# Patient Record
Sex: Female | Born: 1935 | Race: White | Hispanic: No | Smoking: Never smoker
Health system: Southern US, Community
[De-identification: ages and names within clinical notes are randomized; demographics above are authoritative.]

## PROBLEM LIST (undated history)

## (undated) DIAGNOSIS — F32A Depression, unspecified: Secondary | ICD-10-CM

## (undated) DIAGNOSIS — F329 Major depressive disorder, single episode, unspecified: Secondary | ICD-10-CM

## (undated) DIAGNOSIS — F419 Anxiety disorder, unspecified: Secondary | ICD-10-CM

## (undated) DIAGNOSIS — I10 Essential (primary) hypertension: Secondary | ICD-10-CM

## (undated) HISTORY — DX: Major depressive disorder, single episode, unspecified: F32.9

## (undated) HISTORY — DX: Essential (primary) hypertension: I10

## (undated) HISTORY — DX: Depression, unspecified: F32.A

## (undated) HISTORY — DX: Anxiety disorder, unspecified: F41.9

---

## 1971-12-24 HISTORY — PX: VEIN SURGERY: SHX48

## 1981-12-23 HISTORY — PX: HERNIA REPAIR: SHX51

## 2000-10-02 ENCOUNTER — Encounter: Payer: Self-pay | Admitting: *Deleted

## 2000-10-02 ENCOUNTER — Encounter: Admission: RE | Admit: 2000-10-02 | Discharge: 2000-10-02 | Payer: Self-pay | Admitting: *Deleted

## 2001-09-28 ENCOUNTER — Encounter: Admission: RE | Admit: 2001-09-28 | Discharge: 2001-09-28 | Payer: Self-pay | Admitting: Family Medicine

## 2001-09-28 ENCOUNTER — Encounter: Payer: Self-pay | Admitting: Family Medicine

## 2002-08-31 ENCOUNTER — Encounter: Payer: Self-pay | Admitting: Family Medicine

## 2002-08-31 ENCOUNTER — Ambulatory Visit (HOSPITAL_COMMUNITY): Admission: RE | Admit: 2002-08-31 | Discharge: 2002-08-31 | Payer: Self-pay | Admitting: Family Medicine

## 2002-10-01 ENCOUNTER — Encounter: Payer: Self-pay | Admitting: *Deleted

## 2002-10-01 ENCOUNTER — Encounter: Admission: RE | Admit: 2002-10-01 | Discharge: 2002-10-01 | Payer: Self-pay | Admitting: *Deleted

## 2002-12-29 ENCOUNTER — Encounter: Payer: Self-pay | Admitting: Neurosurgery

## 2002-12-29 ENCOUNTER — Encounter: Admission: RE | Admit: 2002-12-29 | Discharge: 2002-12-29 | Payer: Self-pay | Admitting: Neurosurgery

## 2003-01-12 ENCOUNTER — Encounter: Payer: Self-pay | Admitting: Neurosurgery

## 2003-01-12 ENCOUNTER — Encounter: Admission: RE | Admit: 2003-01-12 | Discharge: 2003-01-12 | Payer: Self-pay | Admitting: Neurosurgery

## 2003-01-28 ENCOUNTER — Encounter: Admission: RE | Admit: 2003-01-28 | Discharge: 2003-01-28 | Payer: Self-pay | Admitting: Neurosurgery

## 2003-01-28 ENCOUNTER — Encounter: Payer: Self-pay | Admitting: Neurosurgery

## 2003-10-25 ENCOUNTER — Encounter: Admission: RE | Admit: 2003-10-25 | Discharge: 2003-10-25 | Payer: Self-pay | Admitting: *Deleted

## 2004-11-16 ENCOUNTER — Encounter: Admission: RE | Admit: 2004-11-16 | Discharge: 2004-11-16 | Payer: Self-pay | Admitting: *Deleted

## 2004-12-04 ENCOUNTER — Encounter: Admission: RE | Admit: 2004-12-04 | Discharge: 2004-12-04 | Payer: Self-pay | Admitting: *Deleted

## 2005-01-01 ENCOUNTER — Encounter (INDEPENDENT_AMBULATORY_CARE_PROVIDER_SITE_OTHER): Payer: Self-pay | Admitting: *Deleted

## 2005-01-01 ENCOUNTER — Ambulatory Visit (HOSPITAL_BASED_OUTPATIENT_CLINIC_OR_DEPARTMENT_OTHER): Admission: RE | Admit: 2005-01-01 | Discharge: 2005-01-01 | Payer: Self-pay | Admitting: General Surgery

## 2005-01-01 ENCOUNTER — Ambulatory Visit (HOSPITAL_COMMUNITY): Admission: RE | Admit: 2005-01-01 | Discharge: 2005-01-01 | Payer: Self-pay | Admitting: General Surgery

## 2005-01-01 ENCOUNTER — Encounter: Admission: RE | Admit: 2005-01-01 | Discharge: 2005-01-01 | Payer: Self-pay | Admitting: General Surgery

## 2005-12-23 HISTORY — PX: SHOULDER SURGERY: SHX246

## 2006-01-01 ENCOUNTER — Encounter: Admission: RE | Admit: 2006-01-01 | Discharge: 2006-01-01 | Payer: Self-pay | Admitting: Family Medicine

## 2006-01-10 ENCOUNTER — Ambulatory Visit (HOSPITAL_COMMUNITY): Admission: RE | Admit: 2006-01-10 | Discharge: 2006-01-10 | Payer: Self-pay | Admitting: Family Medicine

## 2006-03-25 ENCOUNTER — Ambulatory Visit (HOSPITAL_COMMUNITY): Admission: RE | Admit: 2006-03-25 | Discharge: 2006-03-25 | Payer: Self-pay | Admitting: Orthopaedic Surgery

## 2006-04-01 ENCOUNTER — Encounter (HOSPITAL_COMMUNITY): Admission: RE | Admit: 2006-04-01 | Discharge: 2006-05-01 | Payer: Self-pay | Admitting: Orthopaedic Surgery

## 2006-05-13 ENCOUNTER — Encounter (HOSPITAL_COMMUNITY): Admission: RE | Admit: 2006-05-13 | Discharge: 2006-06-12 | Payer: Self-pay | Admitting: Orthopaedic Surgery

## 2006-05-27 ENCOUNTER — Observation Stay (HOSPITAL_COMMUNITY): Admission: RE | Admit: 2006-05-27 | Discharge: 2006-05-28 | Payer: Self-pay | Admitting: Orthopaedic Surgery

## 2006-05-27 ENCOUNTER — Encounter (INDEPENDENT_AMBULATORY_CARE_PROVIDER_SITE_OTHER): Payer: Self-pay | Admitting: Specialist

## 2006-07-02 ENCOUNTER — Encounter (HOSPITAL_COMMUNITY): Admission: RE | Admit: 2006-07-02 | Discharge: 2006-08-01 | Payer: Self-pay | Admitting: Orthopaedic Surgery

## 2006-07-09 ENCOUNTER — Inpatient Hospital Stay (HOSPITAL_COMMUNITY): Admission: EM | Admit: 2006-07-09 | Discharge: 2006-07-15 | Payer: Self-pay | Admitting: Emergency Medicine

## 2006-07-10 ENCOUNTER — Ambulatory Visit: Payer: Self-pay | Admitting: *Deleted

## 2006-08-04 ENCOUNTER — Emergency Department (HOSPITAL_COMMUNITY): Admission: EM | Admit: 2006-08-04 | Discharge: 2006-08-05 | Payer: Self-pay | Admitting: Emergency Medicine

## 2006-08-09 ENCOUNTER — Inpatient Hospital Stay (HOSPITAL_COMMUNITY): Admission: EM | Admit: 2006-08-09 | Discharge: 2006-08-13 | Payer: Self-pay | Admitting: Emergency Medicine

## 2006-09-30 ENCOUNTER — Encounter (HOSPITAL_COMMUNITY): Admission: RE | Admit: 2006-09-30 | Discharge: 2006-11-04 | Payer: Self-pay | Admitting: Orthopaedic Surgery

## 2006-10-07 ENCOUNTER — Encounter (HOSPITAL_COMMUNITY): Admission: RE | Admit: 2006-10-07 | Discharge: 2006-11-04 | Payer: Self-pay | Admitting: Neurosurgery

## 2007-01-09 ENCOUNTER — Encounter: Admission: RE | Admit: 2007-01-09 | Discharge: 2007-01-09 | Payer: Self-pay | Admitting: Internal Medicine

## 2007-09-29 ENCOUNTER — Encounter: Admission: RE | Admit: 2007-09-29 | Discharge: 2007-09-29 | Payer: Self-pay | Admitting: Neurosurgery

## 2007-12-29 ENCOUNTER — Encounter (HOSPITAL_COMMUNITY): Admission: RE | Admit: 2007-12-29 | Discharge: 2008-01-28 | Payer: Self-pay | Admitting: Neurosurgery

## 2008-02-05 ENCOUNTER — Encounter: Admission: RE | Admit: 2008-02-05 | Discharge: 2008-02-05 | Payer: Self-pay | Admitting: Internal Medicine

## 2008-02-06 ENCOUNTER — Encounter: Admission: RE | Admit: 2008-02-06 | Discharge: 2008-02-06 | Payer: Self-pay | Admitting: Neurosurgery

## 2008-10-25 ENCOUNTER — Emergency Department (HOSPITAL_COMMUNITY): Admission: EM | Admit: 2008-10-25 | Discharge: 2008-10-26 | Payer: Self-pay | Admitting: Emergency Medicine

## 2008-12-13 ENCOUNTER — Emergency Department (HOSPITAL_COMMUNITY): Admission: EM | Admit: 2008-12-13 | Discharge: 2008-12-13 | Payer: Self-pay | Admitting: Emergency Medicine

## 2008-12-19 ENCOUNTER — Encounter (HOSPITAL_COMMUNITY): Admission: RE | Admit: 2008-12-19 | Discharge: 2009-01-18 | Payer: Self-pay | Admitting: Oncology

## 2008-12-19 ENCOUNTER — Ambulatory Visit (HOSPITAL_COMMUNITY): Payer: Self-pay | Admitting: Oncology

## 2009-02-07 ENCOUNTER — Encounter: Admission: RE | Admit: 2009-02-07 | Discharge: 2009-02-07 | Payer: Self-pay | Admitting: Internal Medicine

## 2009-03-21 IMAGING — CT CT ABDOMEN W/ CM
1 of 2 series · 14 of 32 positions shown, 19 images · IV contrast (Omnipaque 300)
Comparison: 01/10/2006

CT ABDOMEN

CLINICAL DATA: Unexplained hypercoagulability. Evaluate for occult
malignancy.

CT ABDOMEN AND PELVIS WITH CONTRAST
TECHNIQUE: Multidetector CT imaging of the abdomen and pelvis was
performed using the standard protocol following bolus
administration of intravenous contrast.
Contrast: 100 ml intravenous Zmnipaque-H33.

[Series 3: abd_pel 5.0 b40f · axial · 0.84mm/px · z∈[-566,-131]mm · 14 of 97 slices shown, 19 images]
[im 5/97  soft-tissue]
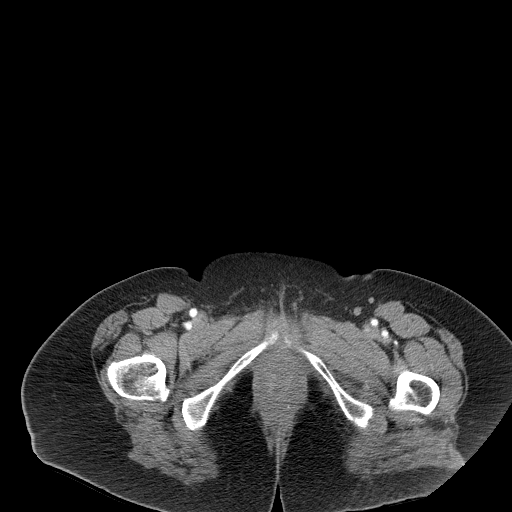
[im 5/97  bone]
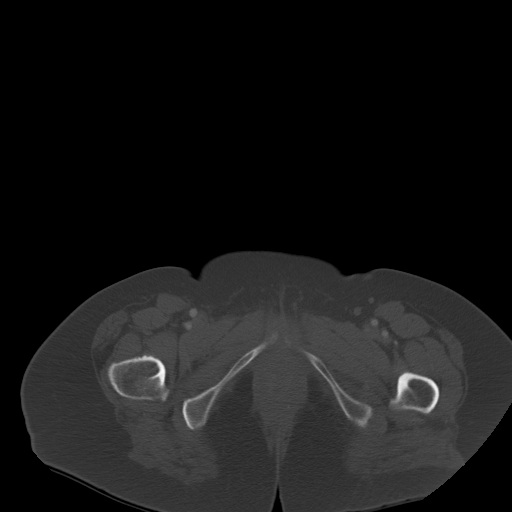
[im 15/97  soft-tissue]
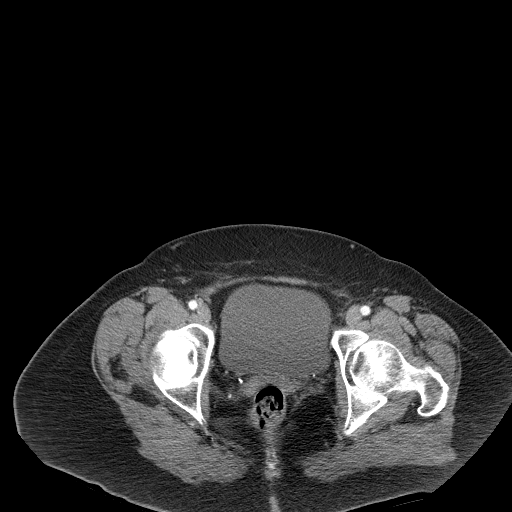
[im 20/97  soft-tissue]
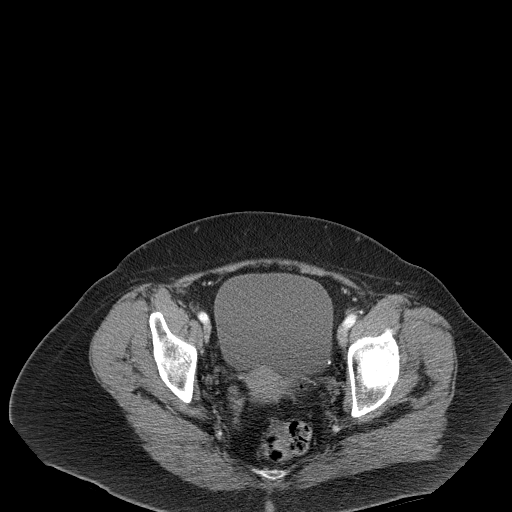
[im 29/97  soft-tissue]
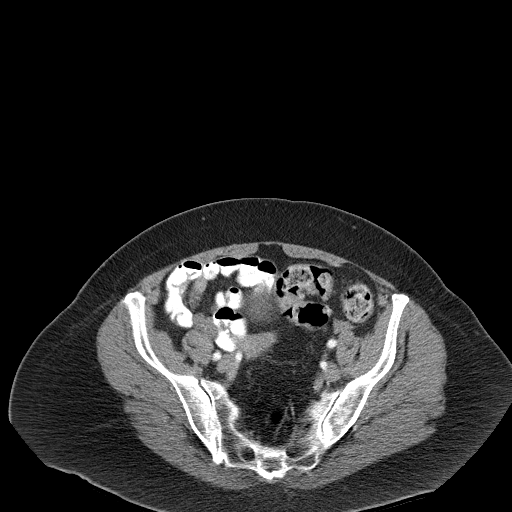
[im 34/97  soft-tissue]
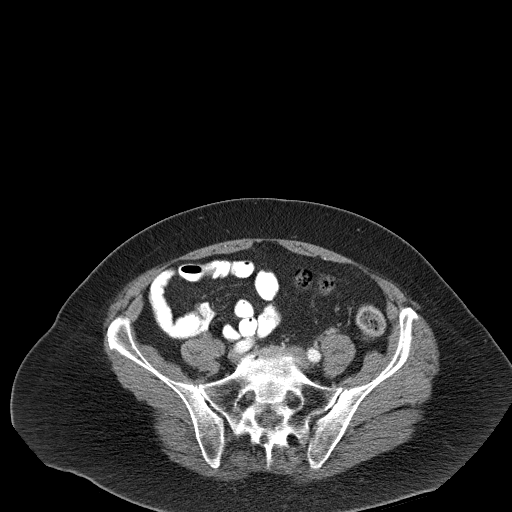
[im 44/97  soft-tissue]
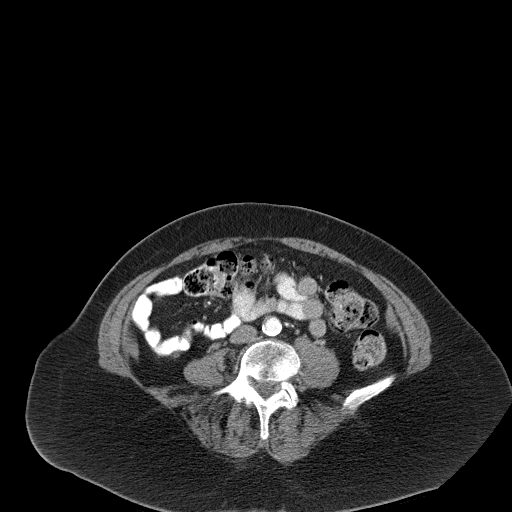
[im 49/97  soft-tissue]
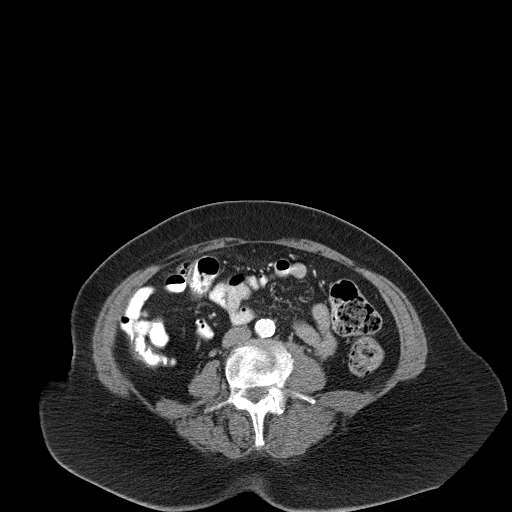
[im 53/97  soft-tissue]
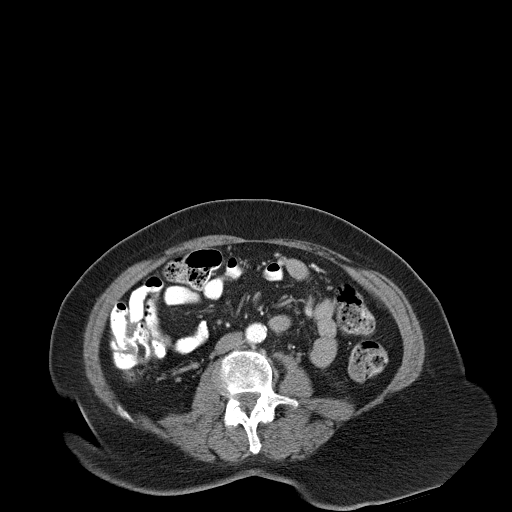
[im 63/97  soft-tissue]
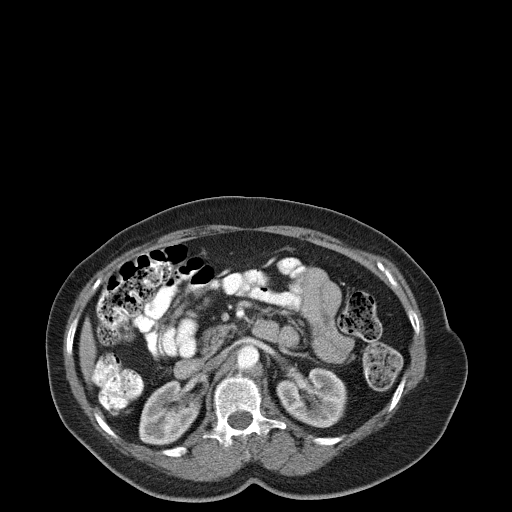
[im 63/97  bone]
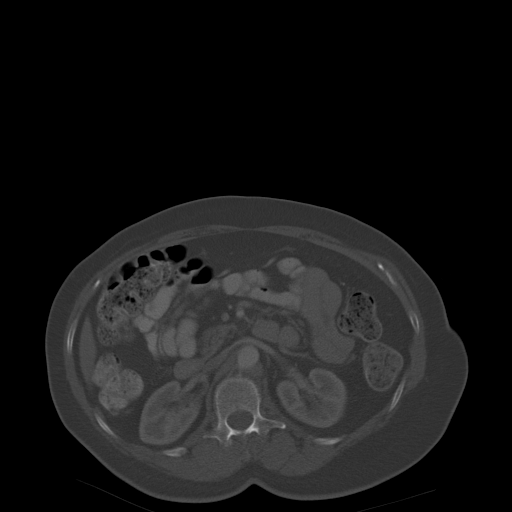
[im 68/97  soft-tissue]
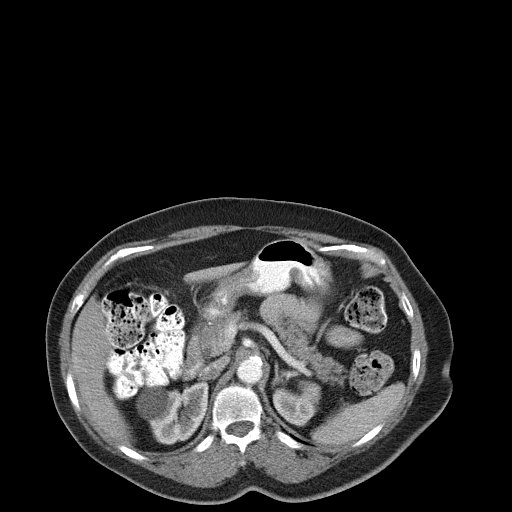
[im 77/97  soft-tissue]
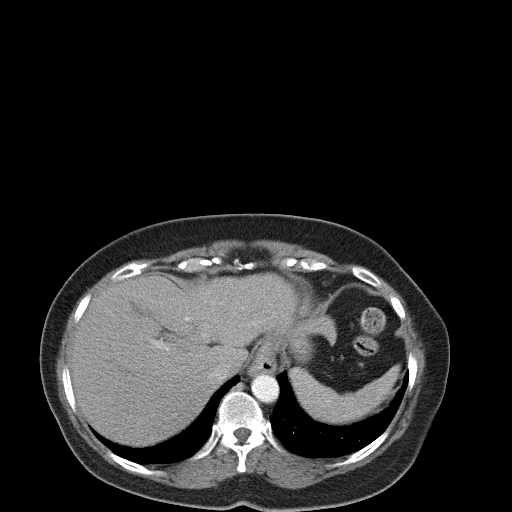
[im 77/97  lung]
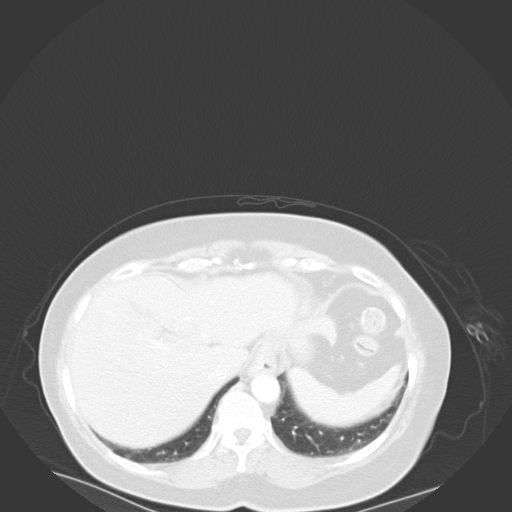
[im 82/97  soft-tissue]
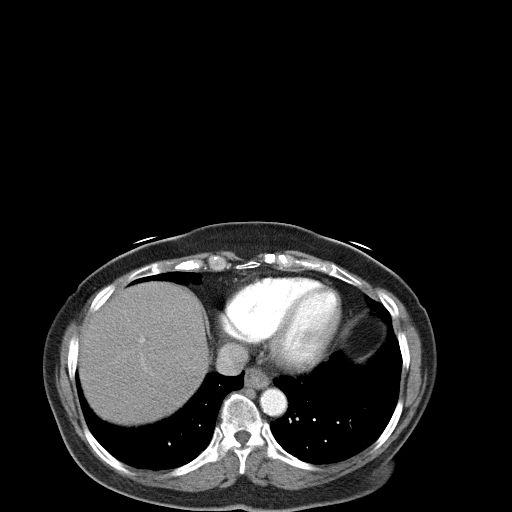
[im 82/97  lung]
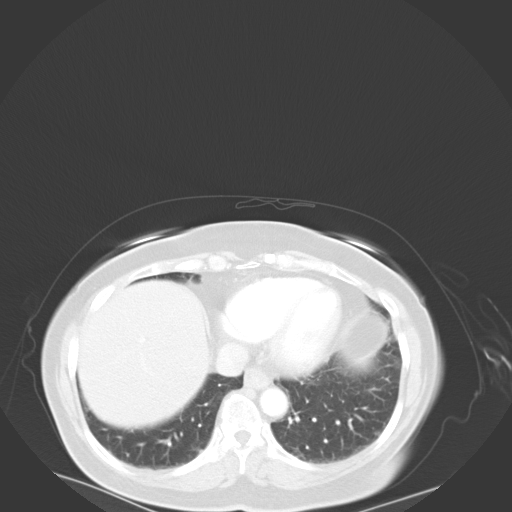
[im 87/97  lung]
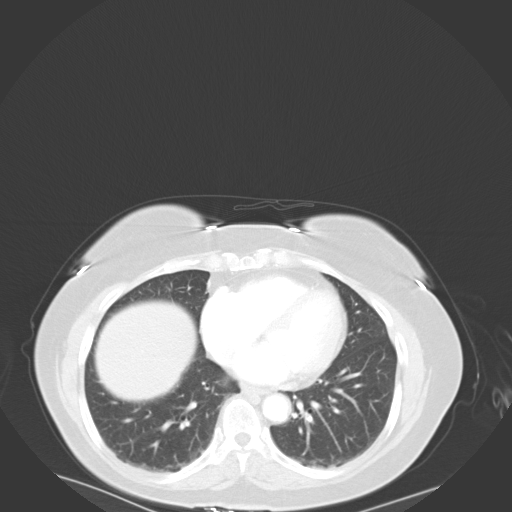
[im 92/97  soft-tissue]
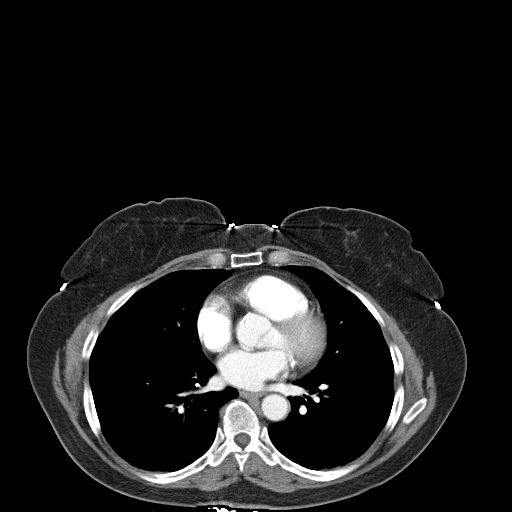
[im 92/97  lung]
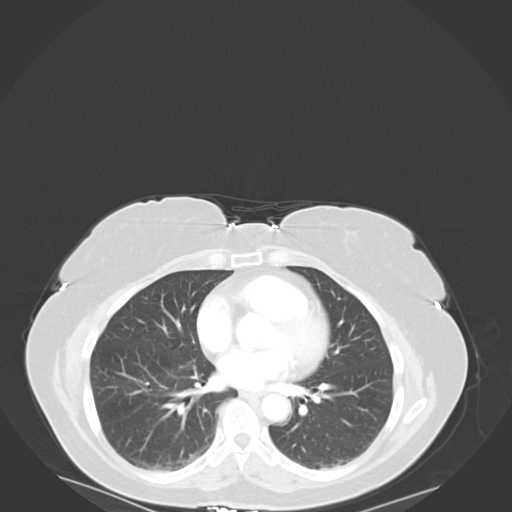

[14 of 32 positions shown; findings below may reference images not displayed]

FINDINGS: A hemangioma within the right liver is again noted. The
remainder of the liver is unremarkable.
The spleen, pancreas, adrenal glands, and gallbladder are
unremarkable.
Bilateral renal cortical thinning and a right renal cysts are
stable.  The kidneys are otherwise unremarkable.
No free fluid, enlarged lymph nodes, biliary dilation or abdominal
aortic aneurysm identified.
The visualized bowel is unremarkable.
No acute or suspicious bony abnormalities are identified.
IMPRESSION: No evidence of acute abnormality or evidence of malignancy within
the abdomen.

CT PELVIS
FINDINGS: The bowel and appendix are unremarkable.
The bladder is within normal limits.
There is no evidence of free fluid, enlarged lymph nodes, or
urinary calculi within the pelvis.
No acute or suspicious bony abnormalities are identified.
IMPRESSION: No evidence of acute abnormality or evidence of malignancy within
the pelvis.

## 2010-03-16 ENCOUNTER — Encounter: Admission: RE | Admit: 2010-03-16 | Discharge: 2010-03-16 | Payer: Self-pay | Admitting: Internal Medicine

## 2011-04-15 ENCOUNTER — Other Ambulatory Visit: Payer: Self-pay | Admitting: Family Medicine

## 2011-04-15 DIAGNOSIS — Z1231 Encounter for screening mammogram for malignant neoplasm of breast: Secondary | ICD-10-CM

## 2011-04-18 ENCOUNTER — Ambulatory Visit
Admission: RE | Admit: 2011-04-18 | Discharge: 2011-04-18 | Disposition: A | Discharge status: Home / Self Care | Payer: Medicare Other | Source: Ambulatory Visit | Attending: Family Medicine | Admitting: Family Medicine

## 2011-04-18 DIAGNOSIS — Z1231 Encounter for screening mammogram for malignant neoplasm of breast: Secondary | ICD-10-CM

## 2011-05-10 NOTE — Consult Note (Signed)
NAME:  Kaitlyn White, Kaitlyn White             ACCOUNT NO.:  [id number]   MEDICAL RECORD NO.:  [id number]          PATIENT TYPE:  INP   LOCATION:  A201                          FACILITY:  [facility]   PHYSICIAN:  Edward L. Juanetta Gosling, M.D.DATE OF BIRTH:  [birth year]   DATE OF CONSULTATION:  07/10/2006  DATE OF DISCHARGE:                                   CONSULTATION   Ms. Alviar is a 77 year old who was admitted with acute bilateral pulmonary  emboli.  She had been in her usual state of pretty good health when she had  right rotator cuff repair on May 27, 2006 and spent one night in the  hospital at that point.  She had done well after that, was getting back to  doing her walking.  She went on a trip over the July 4th holiday to [place]  which is about a seven and a half hour road trip that she took by car.  She  said that she did well after that but then she started back on her walking  program and yesterday noticed that she was very short of breath with  walking.  She denies any problems in her legs.  She did not have any chest  pain but she felt very short of breath.  She did not have any nausea,  sweating, vomiting, leg swelling, or any other complaints.  She has a  history of hypertension, hyperlipidemia, gastroesophageal reflux disease,  chronic allergies, and the rotator cuff surgery.  At home she takes aspirin,  Klonopin, HCTZ, lovastatin 80 mg, Prilosec, and Zyrtec.  The doses of her  medications other than the lovastatin are unknown.  She is allergic to  HYDROCODONE.  Her other past medical history is that she had a breast biopsy  in 2006 which was benign.   SOCIAL HISTORY:  She does not smoke.  She does not drink any alcohol.  Does  not use any other drugs.  She lives at home with her husband.  Her family  history is not known to have any clotting abnormalities and she does not  have any personal history of clotting abnormalities either.  There is a  history of coronary disease and a stomach  cancer in the family.   REVIEW OF SYSTEMS:  Otherwise is pretty much negative.   PHYSICAL EXAMINATION:  GENERAL:  She is awake and alert.  Says she already  feels better.  VITAL SIGNS:  Her temperature is 97, pulse 93, respirations 20, blood  pressure 148/84, O2 saturation is 96% on 1 L.  HEENT:  Her pupils are reactive.  Mucous membranes are moist.  She does not  have any jugular venous distension.  CHEST:  Clear.  HEART:  Regular.  No gallop.  ABDOMEN:  Soft.  EXTREMITIES:  No edema.  CNS:  Grossly intact.   LABORATORY WORK:  Her BNP was 193.  BMET:  BUN was 24, creatinine of 1,  glucose was 149 non-fasting.  CBC showed white count 6400, hemoglobin was  14.  Her D-dimer was 3.73.  Blood gas of pO2 110, pCO2 35, pH 7.47, this on  O2 by nasal cannula.  Cardiac markers:  CK 82, MB was 5.9, troponin 1.26.  Hepatic function was normal and this morning her CBC essentially normal, 54%  lymphocytes.  Her BMET is normal.  Cardiac panel still shows a somewhat  elevated troponin and a somewhat elevated CK-MB.  CT of the chest shows  marked bilateral pulmonary emboli.   ASSESSMENT:  She has acute pulmonary embolism.  I suspect that this was  precipitated by her car trip following surgery which would make her somewhat  hypercoagulable.  I agree with use of Lovenox, Coumadin.  She will probably  require Coumadin for approximately six months post episode.  Agree with  getting a study of her legs to make sure that she does not have venous  thrombosis, although she will be being treated for that and will continue  with all of the other medications as outlined.      Edward L. Juanetta Gosling, M.D.  Electronically Signed     ELH/MEDQ  D:  07/10/2006  T:  07/10/2006  Job:  [id number]   cc:   [facility]

## 2011-05-10 NOTE — Discharge Summary (Signed)
NAME:  Kaitlyn White, Kaitlyn White             ACCOUNT NO.:  [id number]   MEDICAL RECORD NO.:  [id number]          PATIENT TYPE:  INP   LOCATION:  A309                          FACILITY:  [facility]   PHYSICIAN:  Osvaldo Shipper, MD     DATE OF BIRTH:  14-May-1934   DATE OF ADMISSION:  07/09/2006  DATE OF DISCHARGE:  07/24/2007LH                                 DISCHARGE SUMMARY   PRIMARY CARE MEDICAL DOCTOR:  [facility].   DISCHARGE DIAGNOSES:  1.  Acute pulmonary embolism.  2.  Elevated cardiac enzymes secondary to #1.  3.  History of dyslipidemia.  4.  History of hypertension.  5.  History of seasonal allergies.   Please review H&P dictated by myself for details regarding patient's  presenting illness.   BRIEF HOSPITAL COURSE:  Briefly, this is a 77 year old Caucasian female who  has medical problems of hypertension and dyslipidemia who recently had a  surgery for her rotator cuff injury but had to stay overnight.  This is  followed by a long road trip.  The patient presented to the ED with  complaints of shortness of breath, underwent a CAT scan and was found to  have bilaterally extensive pulmonary emboli.  The patient was put on Lovenox  and Coumadin.  The patient was seen by a pulmonologist, who agreed the most  likely risk factors are her recent surgery as well as her prolonged road  trip.   Patient did well during this admission.  She was never hypoxic, even on room  air.  She remained chest pain free.  Her shortness of breath also improved  during the course of this hospital stay.   Elevated cardiac enzymes were noted during this patient's admission.  Her  troponin went up to about 1.2, although her CK-MBs remained normal; however,  because of such a significant rise in her troponin, cardiology was  consulted, who mentioned that this rise was consistent with pulmonary  embolism.  They did not think the patient had any reason for acute coronary  event at this point;  however, they did mention that she may benefit from an  outpatient stress test at some point. Patient also underwent an  echocardiogram which showed normal EF with no wall motion abnormalities.  Depressed RV function was noted secondary likely to acute PE.   The patient was having some neuropathic pain on the right side because of  her disk problems in the back, for which the patient was prescribed  Neurontin, for which she had relief of her symptoms.   Her other medical issues pretty much remained stable.   Her TSH and free T4 were normal.  B12 was also normal.   DISCHARGE MEDICATIONS:  1.  Coumadin 5 mg p.o. q.p.m. PT/INR to be checked on Thursday by her PMD      and further adjustment to the medication done at that time.  2.  Neurontin 200 mg t.i.d.  3.  Ambien 10 mg p.o. nightly, 20 tablets have been prescribed.  4.  Otherwise, she was asked to continue her Zetia, Claritin, omeprazole,  Zocor.   Follow up with her PMD for PT/INR on Thursday.   DIET:  Patient may continue a previous diet of low salt diet.   PHYSICIAN ACTIVITY:  No restrictions.   CONSULTATIONS:  1.  Dr. Juanetta Gosling.  2.  Dr. Dietrich Pates and Dr. Dorethea Clan, cardiologists from [facility].   RECOMMENDATIONS:  We would recommend the patient follow up with a  cardiologist in the next few months for an outpatient stress test.      Osvaldo Shipper, MD  Electronically Signed     GK/MEDQ  D:  07/15/2006  T:  07/15/2006  Job:  [id number]   cc:   Baylor Bing, M.D. [facility]  [address]  [address]  [place]  [place] [address]   Oneal Deputy. Juanetta Gosling, M.D.  Fax: [phone]

## 2011-05-10 NOTE — Op Note (Signed)
NAME:  Kaitlyn White, Kaitlyn White             ACCOUNT NO.:  [id number]   MEDICAL RECORD NO.:  [id number]          PATIENT TYPE:  INP   LOCATION:  A328                          FACILITY:  [facility]   PHYSICIAN:  J. Darreld Mclean, M.D. DATE OF BIRTH:  20-Aug-1934   DATE OF PROCEDURE:  DATE OF DISCHARGE:                                 OPERATIVE REPORT   PREOPERATIVE DIAGNOSIS:  Rotator cuff tear on the right.   POSTOPERATIVE DIAGNOSIS:  Rotator cuff tear on the right.   PROCEDURE:  Repair, rotator cuff tear, right, with near acromioplasty.   ANESTHESIA:  General.   SURGEON:  J. Darreld Mclean, M.D.   ESTIMATED BLOOD LOSS:  Less than 100 cc.   DRAINS:  None.   Shoulder immobilizer applied at the end of the procedure.   INDICATIONS:  Patient is a 77 year old female with pain and tenderness in  the right shoulder.  MRI shows a tear at the insertion of the supraspinatus  on the right.  She has not responded well to conservative treatment, rest,  anti-inflammatories, and injection.  She desires to undergo the procedure.  We went over risks and imponderables of the procedure were discussed  preoperatively.  She appeared to understand and agree with the procedure as  outlined.   DESCRIPTION OF PROCEDURE:  The patient was seen in the holding area.  She  identified the right shoulder as the correct surgical site.  I confirmed  this and placed a mark on the right shoulder.  She was taken back to the  operating room and given general anesthesia.  She was placed in a semi-  barber position, and I had free range of motion of the shoulder with her  sitting on the edge of the table.  She was then prepped and draped in the  usual manner.  We had a time of re-identifying [name] as the patient and  the right shoulder as the correct surgical site.  We made an incision from  the edge of the acromion to the coracoid area.  With careful dissection, the  deltoid was exposed, and a so-called weak area of the  deltoid was opened as  an avascular plane.  The coracoacromial ligament was identified, cut, and  removed sharply.  It freed up the joint.  The undersurface of the acromion  and AC joint had an osteophyte, and this was removed with a broad-based  osteotome and then smoothed down with a power rasp.  Good, smooth contours  obtained.  The tear at the insertion of the supraspinatus was identified and  not retracted much.  I elected to use an anchor to hold this in and roughed  up the bone.  I put the anchor in and then using the sutures from the  anchor, I was able to bring in the supraspinatus and tie it.  I had a very  good repair.  Put the shoulders through a range of motion.  There is no  evidence of any impingement.  The deltoid was reapproximated using 2-0  chromic, interrupted, buried knots.  Subcutaneous tissue was reapproximated  using 2-0 plain,  then a running subcuticular suture of 3-0 nylon was used.  Steri-Strips were applied.  Sterile dressing applied  to the shoulder.  Patient was placed in a shoulder immobilizer.  Tolerated  procedure well.  Went to the recovery room in good condition.  Patient will  be placed on PCA morphine IV and observed overnight.  Orders have been  written.           ______________________________  Shela Commons. Darreld Mclean, M.D.     JWK/MEDQ  D:  05/27/2006  T:  05/27/2006  Job:  [id number]

## 2011-05-10 NOTE — Discharge Summary (Signed)
NAME:  WYOLENE, WEIMANN             ACCOUNT NO.:  [id number]   MEDICAL RECORD NO.:  [id number]          PATIENT TYPE:  INP   LOCATION:  A338                          FACILITY:  [facility]   PHYSICIAN:  Lonia Blood, M.D.      DATE OF BIRTH:  1934/02/19   DATE OF ADMISSION:  08/09/2006  DATE OF DISCHARGE:  08/22/2007LH                                 DISCHARGE SUMMARY   PRIMARY CARE PHYSICIAN:  Dr. Ashley Royalty in [place].   DISCHARGE DIAGNOSES:  1. Persistent headache secondary to degenerative disk disease.  2. Chest pain, rule out myocardial infarction.  3. History of bilateral emboli on chronic Coumadin therapy.  4. Anxiety disorder.  5. Benign tremor at rest.  6. Dyslipidemia.  7. Hypokalemia.  8. Gastroesophageal reflux disease.   DISCHARGE MEDICATIONS:  1. Vicodin 5/500 one tablet every 4 hours as needed.  2. Coumadin to take 4 mg today and subsequently per primary care      physician.  3. Inderal 10 mg twice a day.  4. Celexa 30 mg daily.  5. Hydrochlorothiazide reduced to 12.5 mg daily.  6. Klonopin 0.25 mg 3 times a day.  7. Zocor 20 mg daily.  8. Zetia 10 mg daily.  9. Skelaxin 800 mg twice a day.  10.Ambien 5 mg at night p.r.n.   DISPOSITION:  The patient is currently stable and ready to go home.  She has  no specific complaints today.  Her pain was radiculopathic but has been  stable now.  Her INR was subtherapeutic this morning.  It was 2.1 yesterday  but 1.5 today.  Her Coumadin was held for the last 2 days due to increased  INR on admission; however, it has currently dropped.  She is back on her  Coumadin and will have followup as an outpatient.  She needs to be seen by a  physician tomorrow morning for PT/INR checks.   PROCEDURES PERFORMED THIS ADMISSION:  1. Head CT without contrast on August 09, 2006, showed findings compatible      with small vessel disease involving the posterior limb of the left      internal capsule, age indeterminate.  2. Venous ultrasound  on the upper extremities showed no evidence of left      upper extremity thrombus.  3. MRI of the brain without contrast shows subcortical T2 hyperintensity      bilaterally which are nonspecific, but no acute intracranial      abnormality.  4. MRI of the C spine without contrast shows findings of disk bulging and      shallow central protrusion at all levels from C2-3 through C6-7 without      significant stenosis or foraminal narrowing, mild disk bulge at C4-5      which has progressed slightly but no significant stenosis.      Straightening of the normal cervical lordosis.   CONSULTATIONS:  None.   BRIEF HISTORY AND PHYSICAL:  Please refer to dictated History and Physical  on admission by Dr. Dickie La L. Fugate.  In short, however, the patient came in  with headache and left upper  extremity pain as well as chest pain.  She was  in severe pain on admission and was very anxious.  She was found to be  therapeutic on her INR at 2.5.  Otherwise, her physical exam was  unremarkable.  Her labs were also for the most part unremarkable.  She was  subsequently admitted for management of her severe persistent headache.   HOSPITAL COURSE:  #1.  HEADACHE AND LEFT UPPER EXTREMITY PAIN:  This was thought to be  radiculopathic in nature.  The patient was admitted and started on pain  control with pain medications.  Workup included MRI as indicated above.  MRI  came back as abnormal but not enough to warrant surgical intervention.  Her  pain has improved tremendously at the time of discharge.  She is back only  on Vicodin for her pain control.  Degenerative disk disease was subsequently  diagnosed as possible cause.   #2.  CHEST PAIN: This was also worked up, and cardiac enzymes were checked  serially, but they were negative, indicating that her chest pain has nothing  to do with cardiac at this point, probably radiculopathic.   #3.  BILATERAL PULMONARY EMBOLI: The patient has been therapeutic on  her  Coumadin.  INR was 3.0 on the second day of admission, and her Coumadin was  held.  She remained therapeutic; however, her INR today is 1.5.  I have  offered patient Lovenox to bridge; however, she wants to take a double dose  of her Coumadin today and have her INR checked tomorrow.   #4.  HYPERTENSION:  The patient's blood pressure medication has been  slightly.  She is currently on Inderal b.i.d. as well as hydrochlorothiazide  as indicated above. She is currently on low-dose hydrochlorothiazide.   #5.  DYSLIPIDEMIA:  The patient continues on her statin in the hospital  with no change.   It was also established would continue with Klonopin. Other than that,  patient seems to be stable and ready for discharge at this point.      Lonia Blood, M.D.  Electronically Signed     LG/MEDQ  D:  08/13/2006  T:  08/13/2006  Job:  [id number]

## 2011-05-10 NOTE — Consult Note (Signed)
NAME:  Kaitlyn White, Kaitlyn White             ACCOUNT NO.:  [id number]   MEDICAL RECORD NO.:  [id number]          PATIENT TYPE:  INP   LOCATION:  A201                          FACILITY:  [facility]   PHYSICIAN:  Vida Roller, M.D.   DATE OF BIRTH:  [birth year]   DATE OF CONSULTATION:  07/10/2006  DATE OF DISCHARGE:                                   CONSULTATION   PRIMARY CARE PHYSICIAN:  [facility].   REFERRING PHYSICIAN:  Hospitalists.  She does not have a cardiologist.   DATE OF CONSULTATION:  07/10/2006.   HISTORY OF PRESENT ILLNESS:  Ms. Ruddy is a 77 year old woman who was  admitted to [facility] yesterday with acute bilateral pulmonary emboli.  She  had shoulder surgery about a month ago and had a relatively long car drive  to [place] and subsequently developed acute shortness of breath and  discomfort in her chest.  Her D-Dimer was elevated.  Her CT scan of her  chest shows bilateral acute pulmonary emboli and she is currently receiving  Lovenox and Coumadin.  We were asked to evaluate her because she has  elevated troponins on her cardiac enzymes.   PAST MEDICAL HISTORY:  Significant for hypertension, hyperlipidemia.  She  has a history of seasonal allergies.  There is some question of  hyperglycemia of unknown duration.  She has had a rotator cuff surgery that  was done on the right shoulder back in June.  She had a breast biopsy that  was negative back in 2006.  She is on Zetia 10 mg once a day,  hydrochlorothiazide 12.5 mg once a day, Prilosec 20 mg once a day, aspirin  81 mg once a day, Ativan 1 mg once a day, and fish oil at an unknown dose.  In the hospital, she is almost on Coumadin 5 mg a day and Lovenox.  She  lives in [place].  She does not smoke, drink or use illicit drugs.   FAMILY HISTORY:  Negative.   REVIEW OF SYSTEMS:  Negative.   PHYSICAL EXAMINATION:  GENERAL:  She is a very pleasant white female in no  apparent distress.  She is alert and  oriented x4, denying any chest pain or  shortness of breath.  VITAL SIGNS:  She is afebrile with pulse at 73.  Respirations are 19.  Her  blood pressure is 135/76.  She weighs 164 pounds.  She is saturating at 98%  on 1 liter nasal cannula.  She is a well-developed, well-nourished, thin  white female in no apparent distress.  HEENT:  Normal.  NECK:  Without jugular venous distention or carotid bruits.  CARDIOVASCULAR:  Regular with an S4, but no murmur.  LUNGS:  Clear to auscultation bilaterally with the exception of some  decreased breath sounds in the right base.  SKIN:  Without rashes or lesions.  GU, BREAST AND RECTAL:  Deferred.  ABDOMEN:  Soft, nontender.  LOWER EXTREMITIES:  Without clubbing, cyanosis or edema.  Pulses are 1+.  There are no palpable cords.  NEUROLOGIC:  Nonfocal.  MUSCULOSKELETAL:  Nonfocal.   Chest x-ray is normal.  Chest CT shows extensive bilateral pulmonary emboli.  Electrocardiogram is mildly abnormal.  Sinus rhythm at a rate of 78 with a  PR interval just slightly prolonged at 212 msec.  QRS is prolonged at 122  msec in a right bundle branch block configuration.  She has no ischemic ST-T  wave changes and there are no obvious Q waves.  We do not have any old EKGs  for comparison.  Her admission EKG looks very similar except in sinus  tachycardia.   LABORATORY DATA:  White blood cell count 5.2.  H&H of 14 and 41.  Platelet  count 231.  Sodium 144, potassium 3.8, chloride 108, bicarb 28.9, BUN 19,  creatinine 1.0 and her blood sugar is 115.  LFTs are normal.  Her B-type  natriuretic peptide is 193.  D-Dimer was 3.73.  She has had three sets of  cardiac enzymes, all of which have relatively normal CK and CK-MBs with  troponins that are 0.68, 0.51, 0.42.   ASSESSMENT:  1.  This is a lady with a pulmonary embolus, status post orthopedic surgery      and long car drive, who probably has had a DVT who is currently on      anticoagulation for that.  2.  She  has an abnormal troponin which is in a pattern which is most      consistent with pulmonary embolus and not an acute myocardial      infarction.  3.  Abnormal electrocardiogram, unknown chronicity, appears to be also      consistent with significant right heart strain, probably from the      pulmonary embolus, although it is difficult to know.  She does have      hyperlipidemia and hypertension, both of which appear to be well treated      with her medications.   RECOMMENDATIONS:  Get an echocardiogram, check her thyroid function studies.  If her echocardiogram looks fine, I probably would just continue to treat  her for her pulmonary embolus when she is adequately therapeutic on  Coumadin.  It would not be a bad idea to get a myocardial perfusion imaging  study just to make sure that there is no significant coronary artery disease  concomitantly in this situation.  This probably can be done as an outpatient  as she is not manifesting any evidence of active ischemia.      Vida Roller, M.D.  Electronically Signed     JH/MEDQ  D:  07/10/2006  T:  07/10/2006  Job:  [id number]

## 2011-05-10 NOTE — Group Therapy Note (Signed)
NAME:  Kaitlyn White, Kaitlyn White             ACCOUNT NO.:  [id number]   MEDICAL RECORD NO.:  [id number]          PATIENT TYPE:  INP   LOCATION:  A309                          FACILITY:  [facility]   PHYSICIAN:  Margaretmary Dys, M.D.DATE OF BIRTH:  1934-04-06   DATE OF PROCEDURE:  DATE OF DISCHARGE:                                   PROGRESS NOTE   SUBJECTIVE:  The patient is doing fairly well.  She has very minimal  shortness of breath.  Otherwise, feels great.  She was sitting up and doing  a crossword puzzle when I went into her room.   OBJECTIVE:  GENERAL:  Conscious, alert.  Comfortable, not in acute distress.  VITAL SIGNS:  Blood pressure 124/63, pulse of 61, respirations 18,  temperature 98, oxygen saturation is 99% on 1 L.  HEENT:  Normocephalic, atraumatic.  Oral mucosa was moist.  No exudates.  NECK:  Supple.  No JVD.  No lymphadenopathy.  LUNGS:  Clear.  Good air entry bilaterally.  HEART:  S1, S2 regular.  No S3, S4, gallops, or rubs.  ABDOMEN:  Soft, nontender.  Bowel sounds are positive.  No mass palpable.  EXTREMITIES:  No pitting pedal edema.  No calf induration or tenderness was  noted.   LABORATORY DATA:  White blood cell count was 4.9, hemoglobin of 11.6,  hematocrit 34.1, platelet count was 199 with no left shift.  PT was 19.6,  INR 1.6.  Sodium 142, potassium 4.4, chloride of 108, CO2 of 30, glucose  105, BUN of 15, creatinine is 1.1, calcium is 9.   ASSESSMENT AND PLAN:  [name] has bilateral pulmonary emboli, but she is  doing fairly well and stable.  Oxygen requirement has significantly  decreased.  We will continue her on anticoagulation.  We will await INR to  be close to 2 or above prior to discontinuing her Lovenox and discharging  her home.  She is otherwise  stable .Marland Kitchen  Her blood pressures and other vitals  have remained stable.   We appreciate Dr. Juanetta Gosling' continued input.      Margaretmary Dys, M.D.  Electronically Signed     AM/MEDQ  D:   07/13/2006  T:  07/13/2006  Job:  [id number]

## 2011-05-10 NOTE — H&P (Signed)
NAME:  Kaitlyn White, Kaitlyn White             ACCOUNT NO.:  [id number]   MEDICAL RECORD NO.:  [id number]          PATIENT TYPE:  AMB   LOCATION:  DAY                           FACILITY:  [facility]   PHYSICIAN:  J. Darreld Mclean, M.D. DATE OF BIRTH:  [birth year]   DATE OF ADMISSION:  05/25/2006  DATE OF DISCHARGE:  LH                                HISTORY & PHYSICAL   CHIEF COMPLAINT:  Right shoulder pain.   The patient is a 77 year old female with pain and tenderness on her right  shoulder.  She has had pain and tenderness since July of 2006.  She works at  [employer] in [place].  She has had difficulty raising her  shoulder and using her arm.  She has had numbness and difficulty using it.  I first saw the patient on March 18, 2006 of this year and recommended MRI  of her shoulder and thought she had a rotator cuff tear.  She did not  improve with conservative treatment up to that date.  A MRI of the right  shoulder done on the March 25, 2006 showed a rotator cuff tendonopathy with a  small full-thickness partial width tear in the anterior supraspinatus.  There was no noticeable retraction or atrophy.  She had bulky  acromioclavicular osteoarthritis with some acromial spurring.  There was  subacromial and some deltoid bursitis.   I explained the findings to the patient when I saw her in the office on  March 27, 2006 and I recommended physical therapy and conservative treatment.  I had originally given her injection on her first visit and that helped  significantly.  She continues to go into physical therapy and has been going  on a regular basis.  Her motion which was decreased has increased  significantly.  The problem is she still has pain.  She has pain still  around 6/10 where as range of motion has improved significantly.  We have  talked about this on several occasions.  She has remained out of work.  I  have offered her repeat injections.  She wants to continue the therapy and  she has gotten to the point where the therapist says they can offer her no  more help as far as pain relief.  The patient has had injections, she has  been on anti-inflammatory, she has had rest, taken Naprosyn.  She now wants  to go ahead and proceed with the procedure.  I have gone over the risks and  imponderables of the procedures.  She appears to understand.   PAST HISTORY:  1.  Positive for hypertension.  2.  Diabetes.  3.  Circulatory problems.   ALLERGIES:  She has no allergies.   MEDICATIONS:  She currently takes Zyrtec 10 mg daily, hydrochlorothiazide  12.5 mg daily, Trimethoprim 100 mg 1/2 a day, Lipitor 40 mg a day, lorazepam  1 mg a day.  She takes Prilosec 20 mg daily.  She takes vitamins as well.   The patient does not smoke.  She does not use alcoholic beverages.  High  school graduate.  Dr. Kyra Leyland is  her family physician.  She had breast  surgery in January 2006.  Varicose vein surgery in 1978.  Hernia repair in  1981.  Heart disease runs in the family as well as hypertension.  She  recently got hurt at work while working at the [employer] in July  2006.   The patient lives in [place], [place] and is married.   PHYSICAL EXAMINATION:  VITAL SIGNS:  BP is 130/80, pulse 78, respirations  16, afebrile.  Height 5 foot 6.  Weight 166.  GENERAL:  She is alert, cooperative and oriented.  HEENT:  Negative.  NECK:  Supple.  NECK:  Clear to P&A.  HEART:  Regular without murmur.  ABDOMEN:  Soft and nontender without masses.  EXTREMITIES:  Right shoulder has full range of motion with pain and  tenderness and increased pain with resist at motion to the shoulder.  Some  slight crepitus.  Range of motion of the extremities are normal.  CNS:  Intact.  SKIN:  Intact.   IMPRESSION:  1.  Right rotator cuff tear, supraspinatus.  Failed conservative treatment.  2.  Hypertension.  3.  Diabetes.   PLAN:  Open repair of rotator cuff on the right.  Explained to the  patient  the procedure, risks and imponderables.  Labs are pending.  She appears to  understand and agrees to procedure as outlined.                                            ______________________________  J. Darreld Mclean, M.D.     JWK/MEDQ  D:  05/25/2006  T:  05/26/2006  Job:  [id number]

## 2011-05-10 NOTE — Group Therapy Note (Signed)
NAME:  Kaitlyn White, Kaitlyn White             ACCOUNT NO.:  [id number]   MEDICAL RECORD NO.:  [id number]          PATIENT TYPE:  INP   LOCATION:  A201                          FACILITY:  [facility]   PHYSICIAN:  Margaretmary Dys, M.D.DATE OF BIRTH:  07-Feb-1934   DATE OF PROCEDURE:  08/11/2006  DATE OF DISCHARGE:                                   PROGRESS NOTE   SUBJECTIVE:  The patient feels slightly better today.  Says her headache is  a little bit better.  Chest pain is also slightly improved.  No fevers or  chills.  The patient has gone down for an MRI; the official report is  pending.   OBJECTIVE:  Conscious, alert, comfortable, not in acute distress.  VITAL SIGNS:  Blood pressure 117/67, pulse 69, respirations 25, T-max 98.7.  Oxygen saturation was 97% on room air.  HEENT:  Normocephalic, atraumatic.  Oral mucosa was moist.  No exudates.  NECK:  Supple.  No JVD or lymphadenopathy.  LUNGS:  Clear clinically.  Good air entry bilaterally.  HEART:  S1 S2 regular.  No S3, S4, gallops or rubs.  ABDOMEN:  Soft, nontender, bowel sounds positive.  EXTREMITIES:  No edema, no edema, no induration or tenderness.  No loss of  sensation.  In the upper extremities the patient has good grasp and reflex.   LABORATORY/DIAGNOSTIC DATA:  PT was 32.9, INR 3.0.   ASSESSMENT AND PLAN:  1. Severe neck and left upper extremity pain which appears radiculopathic.      The patient is currently getting an MRI to further evaluate this.  Will      review based on the report of the MRI.  2. Chest pain, rule out myocardial infarction.  Cardiac enzymes have been      negative.  The patient does not have any evidence of pulmonary embolus      on CT scan and INR is therapeutic anyway.  Will continue pain control.      I think we can discontinue her telemetry and discontinue intravenous      fluids at this time.  Will await MRI report.      Margaretmary Dys, M.D.  Electronically Signed     AM/MEDQ  D:  08/11/2006   T:  08/11/2006  Job:  [id number]

## 2011-05-10 NOTE — H&P (Signed)
NAME:  Kaitlyn White, Kaitlyn White             ACCOUNT NO.:  [id number]   MEDICAL RECORD NO.:  [id number]          PATIENT TYPE:  INP   LOCATION:  A201                          FACILITY:  [facility]   PHYSICIAN:  Osvaldo Shipper, MD     DATE OF BIRTH:  18-Dec-1934   DATE OF ADMISSION:  07/09/2006  DATE OF DISCHARGE:  LH                                HISTORY & PHYSICAL   PRIMARY DOCTOR:  [facility].   ADMITTING DIAGNOSES:  1.  Acute bilateral pulmonary emboli.  2.  Elevated cardiac markers likely secondary to #1.  3.  History of dyslipidemia.  4.  History of hypertension.  5.  History of seasonal allergies.   CHIEF COMPLAINT:  Shortness of breath for the past few days.   HISTORY OF PRESENT ILLNESS:  The patient is a 77 year old Caucasian female  who has hypertension, dyslipidemia, allergies, who underwent repair of her  right rotator cuff on May 27, 2006, and spent 1 night in the hospital.  She  presented to her PMD's office today with complaints of shortness of breath  which has been worse over the past 2 days.  The patient mentions that she  has not been feeling quite well ever since her surgery but is unable to  clarify any further.  She denied any chest pain.  She mentioned that she  goes for a walk every morning and states she walked about 30 minutes  yesterday and felt short of breath which is unusual for her.  This morning  she only walked about 15 minutes and then felt acutely short of breath.  No  history of chest pain.  No history of sweating.  No history of nausea,  vomiting.  No history of leg swelling.  She mentioned that apart from her  surgery on May 28, 2006, she also went on a road trip to [place] and [place] which is about 8-hour drive.  This was about 2 weeks ago.  She  mentions that she took breaks during this trip.  No history of cough, fever,  or chills.  No history of orthopnea or PND.   MEDICATIONS AT HOME:  The patient does not know most of the  dosages.  1.  Aspirin 81 mg daily.  2.  Clonazepam, unknown dose.  3.  Hydrochlorothiazide, unknown dose.  4.  Lovastatin 80 mg daily.  5.  Prilosec, unknown dose.  6.  Zyrtec, unknown dose.  7.  Zetia 10 mg a day.   ALLERGIES:  HYDROCODONE CAUSES ITCHING.   PAST MEDICAL HISTORY:  1.  Hypertension.  2.  Dyslipidemia.  3.  Recent rotator cuff repair.  4.  History of a breast biopsy in 2006, which was benign.  5.  Otherwise no other surgeries as well.   SOCIAL HISTORY:  Lives in [place] with her husband.  No smoking use.  No  alcohol use.  No illicit drug use.  She is independent with her ADLs.   FAMILY HISTORY:  No history of DVT or any other thromboembolic events in the  family.  History of coronary artery disease and stomach  cancer.   REVIEW OF SYSTEMS:  A 10-point review of systems was unremarkable except as  mentioned in the HPI.   PHYSICAL EXAMINATION:  VITAL SIGNS:  Temperature is 96.9, blood pressure  138/87, heart rate 100-120 irregular, respiratory rate is about 16,  saturation 99% on 2 liters nasal cannula.  GENERAL:  This is a well-developed, well-nourished individual in no  distress, very anxious.  HEENT:  There is no pallor.  No icterus.  Oral mucous membranes moist.  No  oral lesions are noted.  NECK:  Soft and supple.  CARDIOVASCULAR:  Reveals a split on the S1.  No murmurs appreciated.  No  rubs heard.  No JVD is noted.  LUNGS:  Clear to auscultation bilaterally with good air entry.  ABDOMEN:  Soft, nontender, nondistended.  Bowel sounds are present.  No mass  or organomegaly appreciated.  EXTREMITIES:  Without edema.  No calf tenderness is present.  Peripheral  pulses are palpable.  NEUROLOGIC:  The patient is alert, oriented x3.   LABORATORY DATA:  Her ABG shows pH 7.47, pCO2 is 34, pO2 is 110, saturation  98%.  CBC is completely unremarkable.  D-dimer is 3.73.  B-MET shows glucose  149, BUN is 24, creatinine 1.0.  CK is 82 and CK-MB 5.9.  Troponin  1.26.  On  the point-of-care, troponin had been 0.51 and 0.42.  BNP is 193.   IMAGING STUDIES:  The patient had a chest x-ray which did not show any acute  cardiopulmonary process.  A CT of the chest was done for an elevated D-  dimer, and her symptoms which showed extensive bilateral pulmonary emboli.   IMPRESSION:  1.  This is a 77 year old Caucasian female with medical problems as      discussed earlier who presents with a few day history of shortness of      breath mainly with exertion but somewhat also at rest and is found to      have acute bilateral pulmonary emboli.  She also has elevated troponin      with no elevation in CK.  This elevation in troponin is possibly from      the pulmonary emboli.  The only risk factors are the patient has for      pulmonary embolism include the recent surgery and the recent long road      trip.  2.  She also has mild hyperglycemia.   PLAN:  1.  Acute pulmonary emboli.  We will put her on Lovenox 1 mg/kg b.i.d.      Start her on Coumadin tomorrow.  Check coagulation profile.  We will      also obtain a venous Doppler study of the lower extremities to rule out      deep vein thrombosis.  Consult Dr. Juanetta Gosling for the pulmonary emboli.  2.  Elevated troponin likely from her pulmonary embolism.  We will check a      2D echocardiogram.  Her EKG which I did not mention above shows sinus      rhythm with 1 PVC.  There appears to be left axis deviation.  Intervals      are normal.  There is some evidence for left anterior fascicular block.      There is nonspecific T wave changes in V1, V2.  No old EKGs available to      compare.  I think we will obtain an echocardiogram in view of the      elevated troponin and  her EKG.  I will obtain a cardiology consult,      although as I mentioned above the most likely reason for the elevated      troponin is the pulmonary emboli. 3.  Hyperglycemia.  We will check a fasting level tomorrow morning and do       further tests based on that.  4.  Hypertension.  We will hold her hydrochlorothiazide from now and re-      institute tomorrow when she is a little bit hydrated.  5.  Dyslipidemia.  Continue Lovastatin.  Check LFTs.  6.  Gastrointestinal prophylaxis will be initiated.   Further management decisions will be based on the results of initial testing  and the patient's response to treatment. It is possible the patient may be  able to go home the next day or two to continue outpatient treatment.      Osvaldo Shipper, MD  Electronically Signed     GK/MEDQ  D:  07/09/2006  T:  07/09/2006  Job:  [id number]   cc:   Ramon Dredge L. Juanetta Gosling, M.D.  Fax: [phone]   Nimrod Bing, M.D. [facility]  [address]  [address]  [place]  [place] [address]

## 2011-05-10 NOTE — Group Therapy Note (Signed)
NAME:  Kaitlyn White, Kaitlyn White             ACCOUNT NO.:  [id number]   MEDICAL RECORD NO.:  [id number]          PATIENT TYPE:  INP   LOCATION:  A201                          FACILITY:  [facility]   PHYSICIAN:  Edward L. Juanetta Gosling, M.D.DATE OF BIRTH:  [birth year]   DATE OF PROCEDURE:  07/11/2006  DATE OF DISCHARGE:                                   PROGRESS NOTE   A patient of the [facility] team.   Ms. Wisniewski is doing better. She says she is having some problems with her  foot, but, otherwise, she is doing okay. She has no new complaints.   PHYSICAL EXAMINATION:  GENERAL:  This morning shows she is awake and alert.  Her foot actually looks okay.  VITAL SIGNS:  Pulses are intact. Temperature 98, pulse 79, respirations 20,  blood pressure 144/80, O2 sats 98% on 1-1/2 liters.  CHEST:  Clear.   She had decreased right ventricular systolic function secondary to acute  pulmonary emboli on echocardiogram. The cardiology team has seen her, and it  was felt that her troponin appeared to be likely from the pulmonary  embolism. Her venous ultrasound showed no definite DVT. Her lab work this  morning shows white count 5500, hemoglobin 12.7. TSH was normal.   ASSESSMENT:  I think she is has a pulmonary emboli. She is being managed  appropriately, and I will see her more peripherally now.      Edward L. Juanetta Gosling, M.D.  Electronically Signed     ELH/MEDQ  D:  07/11/2006  T:  07/11/2006  Job:  [id number]

## 2011-05-10 NOTE — Discharge Summary (Signed)
NAME:  Kaitlyn White, Kaitlyn White             ACCOUNT NO.:  [id number]   MEDICAL RECORD NO.:  [id number]          PATIENT TYPE:  INP   LOCATION:  A328                          FACILITY:  [facility]   PHYSICIAN:  J. Darreld Mclean, M.D. DATE OF BIRTH:  1934/08/03   DATE OF ADMISSION:  05/27/2006  DATE OF DISCHARGE:  06/06/2007LH                                 DISCHARGE SUMMARY   DISCHARGE DIAGNOSES:  Rotator cuff tear supraspinatus.   PROCEDURE PERFORMED:  NIR acromioplasty, repair rotator cuff right shoulder.   DISCHARGE STATUS:  Improved.   PROGNOSIS:  Good.   DISPOSITION:  Home.   DISCHARGE MEDICATIONS:  Tylox.  Seen in my office on June 14 at 2:00.  Patient to keep the wound dry.  Wound care has been explained.   Patient underwent the above-mentioned procedure day of admission and  tolerated it well.  She was placed on PCA pump.  This controlled her pain.  First day she was having pain but it was controlled by the PCA and had her  get up, move about.  The following day she was moving around, had very  little difficulty, very little pain.  Wound care was explained to her in  details.  Laboratories were within normal limits.  She is to continue using  a shoulder mobilizer.  Will see in the office as stated.  She can contact me  through the office hospital beeper system.                                            ______________________________  Shela Commons. Darreld Mclean, M.D.     JWK/MEDQ  D:  06/12/2006  T:  06/12/2006  Job:  [id number]

## 2011-05-10 NOTE — H&P (Signed)
NAME:  Kaitlyn White, PROCTOR             ACCOUNT NO.:  [id number]   MEDICAL RECORD NO.:  [id number]          PATIENT TYPE:  INP   LOCATION:  A201                          FACILITY:  [facility]   PHYSICIAN:  Toby L. Fugate, D.O.   DATE OF BIRTH:  [birth year]   DATE OF ADMISSION:  08/09/2006  DATE OF DISCHARGE:  LH                                HISTORY & PHYSICAL   PRIMARY CARE DOCTOR:  Dr. Lenard Galloway, this physician is in [place].   REASON FOR VISIT:  Headache, left upper extremity pain and chest pain.   HISTORY OF PRESENT ILLNESS:  Kaitlyn White is a 77 year old Caucasian female  who was diagnosed with bilateral pulmonary emboli in July 2007.  She was  subsequently placed on Coumadin.  Since then her family says she has  gradually gone downhill.  Lately she has been complaining of a persistent  headache that is frontal in nature.  There is no changes in vision or  hearing.  There is no neck stiffness.  She was evaluated in the ED for this.  There was evidence of a possible old infarct.  Otherwise, the CT of the head  was unremarkable.  The previously mentioned ED visit was on August 04, 2006.  Since this ED visit the patient has continued to have headaches that have  waxed and waned.  In addition, she has developed left upper extremity pain.  There is no swelling.  She saw her primary care physician who ordered  Dopplers of her left upper extremity.  This study was scheduled to be done  next week.  In addition to the headache and left upper extremity pain, the  patient complains of stabbing like chest pain that is 6/10.  There is no  radiation and no other associated symptoms.  The patient has no history of  coronary artery disease.  She does have a family history of coronary artery  disease.  She herself has hyperlipidemia and hypertension.  She is not a  smoker.  The patient does admit to being very anxious since her diagnosis of  pulmonary emboli.  She had been placed on Klonopin and citalopram  for this.  Despite this she continues to be quite anxious. The patient says that  currently her headache has improved somewhat.  However, her left upper  extremity pain continues to be 8/10.   PAST MEDICAL AND SURGICAL HISTORY:  1. Bilateral pulmonary emboli.  2. Hyperlipidemia.  3. Hypertension.  4. Seasonal allergies.  5. Rotator cuff repair.  6. Anxiety.   MEDICATIONS:  1. Citalopram 20 mg p.o. q.24 h.  2. Lortab 5 mg one p.o. every 4 hours as needed.  3. Clonazepam 0.25 mg p.o. t.i.d. (this dose needs to be confirmed).  4. Hydrochlorothiazide 25 mg p.o. q.24 h. (again, this dose needs to be      confirmed).  5. Lovastatin 40 mg p.o. q.h.s.  6. Protonix 40 mg p.o. daily.  7. Zetia 10 mg p.o. daily.  8. Ambien 5 mg p.o. q.h.s.  9. Coumadin 2.5 mg two tablets orally on Monday, Wednesday, Friday and one  tablet orally on Tuesday, Thursday, Saturday, and Sunday.   ALLERGIES:  HYDROCODONE.   SOCIAL HISTORY:  She denies tobacco, alcohol, and IV drug abuse.  She is  married.   FAMILY HISTORY:  Coronary artery disease and stomach cancer.   REVIEW OF SYSTEMS:  A complete 12-point review of systems was obtained and  review is negative except for that as stated in HPI.   PHYSICAL EXAMINATION:  VITAL SIGNS:  Temperature 98.4, pulse 76, respiratory  rate 16, blood pressure 139/86.  HEENT:  Pupils equally round and reactive to light.  Extraocular muscles  intact.  No scleral icterus.  Oropharynx clear and moist.  No erythema or  thrush.  NECK:  No JVD, no carotid bruit, no adenopathy.  HEART:  Regular rate and rhythm.  No murmurs, rubs or gallops.  LUNGS:  Clear to auscultation bilaterally.  No wheezes, rales or rhonchi.  ABDOMEN:  Positive bowel sounds, nontender, nondistended.  EXTREMITIES:  No edema, clubbing or cyanosis.  NEUROLOGICAL EXAM:  Cranial nerves II-XII intact.  No focal deficits.  DTRs  2/4 in all extremities.  Strength 5/5 in all extremities.   LABORATORIES:   White blood cell count 5.8, hemoglobin 13.5, hematocrit 39.4,  platelets 225, INR 2.5.  Sodium 137, potassium 3.1, chloride 102, carbon  dioxide 29, glucose 134, BUN 10, creatinine 0.9, calcium 9.2, total protein  6.5, albumin 4.0, AST 23, ALT 19, alkaline phosphatase 38.  EKG showed  normal sinus rhythm.   ASSESSMENT/PLAN:  1. Persistent headache.  The etiology of the patient's headache is not      clear at this point.  She is on Coumadin.  Her neurological exam is      nonfocal.  A CT of the head on August 04, 2006 was compatible with      small vessel disease involving the posterior limb of the left internal      capsule.  The CT of the head was otherwise unremarkable.  Given that      the patient has persistent headaches I will order an MRI of the brain.      I will provide Lortab for pain relief.  2. Left upper extremity pain.  On physical examination the patient does      not have much in the way of tenderness to palpation.  However, she      continues to say that her left upper extremity throbs.  She says the      pain is very, very deep.  I will order a left upper extremity Doppler      to rule out thrombus given that the patient has a history of pulmonary      emboli.  3. Chest pain.  I doubt that this is cardiac in nature. An EKG showed a      normal sinus rhythm.  I will request troponins x3.  4. Hypokalemia.  The patient's potassium was replaced in the emergency      department.  5. History of bilateral pulmonary emboli.  The patient is currently on      Coumadin.  Her INR is therapeutic.  6. Anxiety.  The patient's anxiety appears to be getting worse since I      last saw her back a few weeks ago.  Her anxiety may be playing a role      in her headache and left upper extremity pain and chest pain.  I will      continue Klonopin and Celexa.  7.  Hyperlipidemia.  I will continue the patient on Zetia and lovastatin. 8. Anticoagulation.  I will continue the patient on her  current dose of      Coumadin.  9. This patient is Full Code.      Toby L. Fugate, D.O.  Electronically Signed     TLF/MEDQ  D:  08/09/2006  T:  08/09/2006  Job:  [id number]

## 2011-05-10 NOTE — Op Note (Signed)
NAME:  Kaitlyn White, FAKE             ACCOUNT NO.:  [id number]   MEDICAL RECORD NO.:  [id number]          PATIENT TYPE:  AMB   LOCATION:  DSC                          FACILITY:  [facility]   PHYSICIAN:  Rose Phi. Maple Hudson, M.D.   DATE OF BIRTH:  12/14/1934   DATE OF PROCEDURE:  01/01/2005  DATE OF DISCHARGE:                                 OPERATIVE REPORT   PREOPERATIVE DIAGNOSES:  Abnormal left breast mammogram.   POSTOPERATIVE DIAGNOSES:  Abnormal left breast mammogram.   OPERATION:  Left breast biopsy with needle location.   SPECIMENS:  Mammogram.   SURGEON:  Rose Phi. Maple Hudson, M.D.   ANESTHESIA:  MAC.   DESCRIPTION OF PROCEDURE:  The patient was placed on the operating table  with arms extended on the arm board and the left breast prepped and draped  in the usual fashion.  Using the previously placed localizing wire which was  in the medial part of the left breast in the upper portion as a guide, a  curved incision was outlined with a marking pencil and then the area  thoroughly infiltrated with a local anesthetic mixture. The incision was  made, we exposed the wire and then widely excised the wire and surrounding  tissue. Hemostasis obtained with the cautery.   Specimen mammogram confirmed the removal of the lesion.   With good hemostasis, we closed the incision with two layers of 3-0 Vicryl  and subcuticular 4-0 Monocryl and Steri-Strips.   Dressings were then applied and the patient transferred to the recovery room  in satisfactory condition having tolerated the procedure well.      Pete   PRY/MEDQ  D:  01/01/2005  T:  01/01/2005  Job:  [id number]

## 2011-05-10 NOTE — Procedures (Signed)
NAME:  Kaitlyn White, Kaitlyn White             ACCOUNT NO.:  [id number]   MEDICAL RECORD NO.:  [id number]          PATIENT TYPE:  INP   LOCATION:  A201                          FACILITY:  [facility]   PHYSICIAN:  Vida Roller, M.D.   DATE OF BIRTH:  26-Dec-1933   DATE OF PROCEDURE:  07/10/2006  DATE OF DISCHARGE:                                  ECHOCARDIOGRAM   PRIMARY:  Dr. Juanetta Gosling.   TAPE NUMBER:  [id number].   TAPE COUNT:  0 through 378.   This is a woman with a pulmonary emboli.  Technical quality is adequate.  M-  mode tracing of the aorta was 30 mm.   Left atrium is 32 mm.   Septum is 13 mm.   Posterior wall is 12 mm.   Right ventricular diastolic dimension is 38 mm.   Left ventricular systolic dimension 25 mm.   2-D and Doppler imaging:  The left ventricle is normal size.  There is  preserved LV systolic function with an estimated ejection fraction of 65-  70%.  There are no wall motion abnormalities seen.  There is mild concentric  left ventricular hypertrophy.  The right ventricle is dilated.  There is  mildly depressed RV systolic function.   The right atrium is dilated.  The left atrium is normal size.   There is no significant valvular heart disease.   There is no pericardial effusion.   There is insufficient tricuspid regurgitation to assess RV systolic  pressure.   ASSESSMENT:  Decreased right ventricular systolic function likely secondary  to acute pulmonary emboli.      Vida Roller, M.D.  Electronically Signed     JH/MEDQ  D:  07/10/2006  T:  07/10/2006  Job:  [id number]

## 2011-09-24 LAB — BASIC METABOLIC PANEL
BUN: 21
Chloride: 101
Creatinine, Ser: 1.09
Glucose, Bld: 105 — ABNORMAL HIGH
Potassium: 3.5

## 2011-09-24 LAB — DIFFERENTIAL
Basophils Absolute: 0
Basophils Relative: 0
Eosinophils Absolute: 0.1
Eosinophils Relative: 2
Neutrophils Relative %: 47

## 2011-09-24 LAB — CBC
HCT: 37.6
MCV: 89.8
Platelets: 175
RDW: 13.7

## 2011-09-24 LAB — FACTOR 5 LEIDEN

## 2011-09-27 LAB — PROTIME-INR: Prothrombin Time: 31.1 seconds — ABNORMAL HIGH (ref 11.6–15.2)

## 2011-09-27 LAB — COMPREHENSIVE METABOLIC PANEL
ALT: 24 U/L (ref 0–35)
AST: 20 U/L (ref 0–37)
Calcium: 9.9 mg/dL (ref 8.4–10.5)
GFR calc Af Amer: >60 mL/min (ref >60)
Sodium: 137 mEq/L (ref 135–145)
Total Protein: 7.1 g/dL (ref 6.0–8.3)

## 2011-09-27 LAB — DIFFERENTIAL
Eosinophils Absolute: 0.1 10*3/uL (ref 0.0–0.7)
Eosinophils Absolute: 0.1 10*3/uL (ref 0.0–0.7)
Eosinophils Relative: 2 % (ref 0–5)
Eosinophils Relative: 2 % (ref 0–5)
Lymphs Abs: 2.8 10*3/uL (ref 0.7–4.0)
Lymphs Abs: 3.3 10*3/uL (ref 0.7–4.0)
Monocytes Relative: 5 % (ref 3–12)

## 2011-09-27 LAB — LUPUS ANTICOAGULANT PANEL
DRVVT: 62.4 secs — ABNORMAL HIGH (ref 36.1–47.0)
Lupus Anticoagulant: NOT DETECTED
PTTLA 4:1 Mix: 49.6 secs — ABNORMAL HIGH (ref 36.3–48.8)
dRVVT Incubated 1:1 Mix: 40.7 secs (ref 36.1–47.0)

## 2011-09-27 LAB — BETA-2-GLYCOPROTEIN I ABS, IGG/M/A
Beta-2 Glyco I IgG: <4 U/mL (ref <20)
Beta-2-Glycoprotein I IgM: <4 U/mL (ref <10)

## 2011-09-27 LAB — CBC
HCT: 41.4 % (ref 36.0–46.0)
MCHC: 33.4 g/dL (ref 30.0–36.0)
MCHC: 33.6 g/dL (ref 30.0–36.0)
MCV: 88.3 fL (ref 78.0–100.0)
Platelets: 203 10*3/uL (ref 150–400)
RBC: 4.65 MIL/uL (ref 3.87–5.11)
RDW: 14.2 % (ref 11.5–15.5)
WBC: 6 10*3/uL (ref 4.0–10.5)

## 2011-09-27 LAB — BASIC METABOLIC PANEL
BUN: 19 mg/dL (ref 6–23)
CO2: 33 mEq/L — ABNORMAL HIGH (ref 19–32)
Chloride: 103 mEq/L (ref 96–112)
Glucose, Bld: 99 mg/dL (ref 70–99)
Potassium: 3.3 mEq/L — ABNORMAL LOW (ref 3.5–5.1)

## 2011-09-27 LAB — PROTEIN S ACTIVITY: Protein S Activity: 47 % — ABNORMAL LOW (ref 69–129)

## 2011-09-27 LAB — CARDIOLIPIN ANTIBODIES, IGG, IGM, IGA
Anticardiolipin IgA: <7 [APL'U] — ABNORMAL LOW (ref <13)
Anticardiolipin IgM: <7 [MPL'U] — ABNORMAL LOW (ref <10)

## 2015-02-24 ENCOUNTER — Ambulatory Visit: Payer: Self-pay | Admitting: Diagnostic Neuroimaging

## 2015-03-03 ENCOUNTER — Encounter: Payer: Self-pay | Admitting: Neurology

## 2015-03-03 ENCOUNTER — Ambulatory Visit (INDEPENDENT_AMBULATORY_CARE_PROVIDER_SITE_OTHER): Payer: Medicare Other | Admitting: Neurology

## 2015-03-03 VITALS — BP 152/74 | HR 60 | Temp 98.2°F | Resp 16 | Ht 65.0 in | Wt 179.0 lb

## 2015-03-03 DIAGNOSIS — G250 Essential tremor: Secondary | ICD-10-CM

## 2015-03-03 DIAGNOSIS — R2681 Unsteadiness on feet: Secondary | ICD-10-CM | POA: Diagnosis not present

## 2015-03-03 DIAGNOSIS — F419 Anxiety disorder, unspecified: Secondary | ICD-10-CM | POA: Diagnosis not present

## 2015-03-03 NOTE — Patient Instructions (Addendum)
  Please remember, that any kind of tremor may be exacerbated by anxiety, anger, nervousness, excitement, dehydration, sleep deprivation, by caffeine, and low blood sugar values or blood sugar fluctuations. Some medications, especially some antidepressants and lithium can cause or exacerbate tremors. Tremors may temporarily calm down her subside with the use of a benzodiazepine such as Valium or related medications and with alcohol. Be aware however that drinking alcohol is not an approved treatment or appropriate treatment for tremor control and long-term use of benzodiazepines such as Valium, lorazepam, alprazolam, or clonazepam can cause habit formation, physical and psychological addiction.  I will see you back as needed. I did not detect any telltale signs of parkinsonism.  You can continue your tremor medications at the current dose.   Consider using a cane for safety and gait stability.

## 2015-03-03 NOTE — Progress Notes (Signed)
Subjective:    Patient ID: Kaitlyn White is a 81 y.o. female.  HPI     Kaitlyn Foley, MD, PhD [facility] [address] [address] [place], [place] [address]  Dear Kaitlyn White,   I saw your patient, Kaitlyn White, upon your kind request in my neurologic clinic today for initial consultation of her tremors. The patient is accompanied by her daughter today. I reviewed your office note from 02/03/2015, thank you for including the note. As you know, Kaitlyn White is a 82 year old right-handed woman with an underlying medical history depression, anxiety, hyperlipidemia, and recurrent PE, on Coumadin, who was diagnosed with tremors several years ago, probably over 15 years ago. She was seen at [facility] in the past and also saw Dr. Nash White in our office on 03/24/08 for the same problem. I reviewed office records from Dr. Nash White. The patient was encouraged to continue propranolol 40 mg 3 times a day and she was on gabapentin for back pain and numbness, 300 mg twice daily and 600 mg at night. Previous workup included an MRI brain without contrast on 02/24/2008, which showed normal for age findings, with minimal nonspecific subcortical white matter changes. I reviewed the report. She had EMG and nerve conduction testing to the lower extremities on 02/24/2008 which showed mildly abnormal findings with possible focal neuropathy involving peroneal branch on the left. There was no evidence of a widespread polyneuropathy, plexopathy or radiculopathy.  She reports that her tremor bothers her enough to affect her ability to feed herself, bright checks, apply makeup and do other fine motor tasks. She prefers to eat with her left as the tremor seems worse on the right. I reviewed the office note from Kaitlyn White at [facility] movement disorders from 01/06/2014. He felt she had primarily essential tremor, her anxiety was suboptimally treated, and she had mild parkinsonism. He did not suggest any  medications for parkinsonism. He did not think she was a candidate for DBS placement for tremors. He suggests that she be referred to geriatric psychiatry for anxiety management. She sees pain management for back pain. She has significant rigidity back disease but is not a surgical candidate. She is on tramadol as needed. She has been on primidone 250 mg twice daily and propranolol 60 mg 3 times a day and does not think these are very helpful. In addition, she is on gabapentin. When she tried to reduce the gabapentin she noticed a flareup in her tremors. Her father lived to be 12 years old and died of a massive heart attack. She is not aware of any family history of tremors but her 25 year old son has reported hand tremors recently to her.  Her Past Medical History Is Significant For: Past Medical History  Diagnosis Date  . Hypertension   . Depression   . Anxiety     Her Past Surgical History Is Significant For: Past Surgical History  Procedure Laterality Date  . Shoulder surgery  2007  . Vein surgery  1973  . Hernia repair  1983    Double    Her Family History Is Significant For: Family History  Problem Relation Age of Onset  . Cancer Brother   . Heart attack Paternal Grandmother   . Cancer Paternal Grandfather   . Cancer Daughter     Her Social History Is Significant For: History   Social History  . Marital Status: Legally Separated    Spouse Name: N/A  . Number of Children: 5  .  Years of Education: [employer]   Social History Main Topics  . Smoking status: Never Smoker   . Smokeless tobacco: Not on file  . Alcohol Use: No  . Drug Use: No  . Sexual Activity: Not on file   Other Topics Concern  . None   Social History Narrative   No caffeine use    Her Allergies Are:  No Known Allergies:   Her Current Medications Are:  Outpatient Encounter Prescriptions as of 03/03/2015  Medication Sig  . ALPRAZolam (XANAX) 0.25 MG tablet Take by mouth.  . ALPRAZolam (XANAX)  0.5 MG tablet Take 0.5 mg by mouth 3 (three) times daily as needed for anxiety.  Marland Kitchen atorvastatin (LIPITOR) 40 MG tablet Take 40 mg by mouth daily.  Marland Kitchen atorvastatin (LIPITOR) 40 MG tablet Take by mouth.  . citalopram (CELEXA) 10 MG tablet Take 10 mg by mouth daily.  Marland Kitchen gabapentin (NEURONTIN) 600 MG tablet Take by mouth.  . gabapentin (NEURONTIN) 600 MG tablet Take by mouth.  . primidone (MYSOLINE) 250 MG tablet Take 250 mg by mouth.  . primidone (MYSOLINE) 250 MG tablet Take by mouth.  . propranolol (INDERAL) 60 MG tablet Take 60 mg by mouth.  . propranolol (INDERAL) 60 MG tablet Take by mouth.  . traMADol (ULTRAM) 50 MG tablet Take by mouth.  . warfarin (COUMADIN) 1 MG tablet Take 1 mg by mouth.  . [DISCONTINUED] venlafaxine (EFFEXOR) 37.5 MG tablet Take 37.5 mg by mouth.  :   Review of Systems:  Out of a complete 14 point review of systems, all are reviewed and negative with the exception of these symptoms as listed below:   Review of Systems  Constitutional: Positive for fatigue.  Respiratory:       Snoring  Cardiovascular: Positive for leg swelling.       Right leg swelling   Gastrointestinal: Positive for constipation.  Genitourinary: Positive for dysuria.  Allergic/Immunologic: Positive for environmental allergies.  Neurological: Positive for tremors, weakness and numbness.       "Some" memory loss, R Foot Numbness,    Hematological: Bruises/bleeds easily.  Psychiatric/Behavioral:       Depression, Too much sleep, Decreased energy, Change in appetite, Disinterest in activities, Racing thoughts.     Objective:  Neurologic Exam  Physical Exam Physical Examination:   Filed Vitals:   03/03/15 1055  BP: 152/74  Pulse: 60  Temp: 98.2 F (36.8 C)  Resp: 16   General Examination: The patient is a very pleasant 81 y.o. female in no acute distress. She appears well-developed and well-nourished and very well groomed. She is mildly anxious appearing.  HEENT: Normocephalic,  atraumatic, pupils are equal, round and reactive to light and accommodation. Funduscopic exam is normal with sharp disc margins noted. Extraocular tracking is good without limitation to gaze excursion or nystagmus noted. Normal smooth pursuit is noted. Hearing is grossly intact. Face is symmetric with normal facial animation and normal facial sensation. Speech is clear with no dysarthria noted. There is no hypophonia. There is a moderate degree of neck and lower jaw tremor. She has a mild lip tremor as well. She has no significant voice tremor. Neck is supple with full range of passive and active motion. There are no carotid bruits on auscultation. Oropharynx exam reveals: moderate mouth dryness, adequate dental hygiene and mild airway crowding. Mallampati is class I. Tongue protrudes centrally and palate elevates symmetrically.   Chest: Clear to auscultation without wheezing, rhonchi or crackles noted.  Heart: S1+S2+0, regular and  normal without murmurs, rubs or gallops noted.   Abdomen: Soft, non-tender and non-distended with normal bowel sounds appreciated on auscultation.  Extremities: There is no pitting edema in the distal lower extremities bilaterally. Pedal pulses are intact. She has mild puffiness of her feet.  Skin: Warm and dry without trophic changes noted. There are no varicose veins.  Musculoskeletal: exam reveals no obvious joint deformities, tenderness or joint swelling or erythema, with the exception of scoliosis noted and abnormal posture with sitting and standing with a lean or kink to the left..   Neurologically:  Mental status: The patient is awake, alert and oriented in all 4 spheres. Her immediate and remote memory, attention, language skills and fund of knowledge are appropriate. There is no evidence of aphasia, agnosia, apraxia or anomia. Speech is clear with normal prosody and enunciation. Thought process is linear. Mood is normal and affect is normal.  Cranial nerves II -  XII are as described above under HEENT exam. In addition: shoulder shrug is normal with equal shoulder height noted. Motor exam: Normal bulk, strength and tone is noted. There is no drift, or rebound. She has a mild resting tremor in both upper extremities. She has a moderate postural and action tremor in both upper extremities, right a little worse than left. On Archimedes spiral drawing she has a coarse bilateral tremor, right a little bit less than left. Her handwriting with her right hand is not micrographic and coarsely tremulous but legible. Romberg is negative. Reflexes are 2+ throughout. Babinski: Toes are flexor bilaterally. Fine motor skills and coordination: She has mild impairment of all fine motor skills but no significant decrement in amplitude.   Cerebellar testing: No dysmetria or intention tremor on finger to nose testing. Heel to shin is unremarkable bilaterally. There is no truncal or gait ataxia.  Sensory exam: intact to light touch, pinprick, vibration, temperature sense in the upper and lower extremities.  Gait, station and balance: She stands with difficulty. No veering to one side is noted. No leaning to one side is noted. Posture is  mild to moderately stooped and slightly tilted to the left with scoliosis present as well. Stance is slightly wide-based. Gait shows normal stride length and decreased pace. No problems turning are noted.  she has preserved arm swing. She walks cautiously and turns slowly. She is slightly unstable turning. Tandem walk is not possible for her and balance is mildly impaired.               Assessment and Plan:   In summary, Kaitlyn White is a very pleasant 81 y.o.-year old female with an underlying medical history depression, anxiety, hyperlipidemia, and recurrent PE, on Coumadin, who was diagnosed with tremors several years ago, probably over 15 years ago. Her history and physical exam are in keeping with essential tremor. She has a history of  progression of her symptoms and a family history of tremors only in her own son. I did not detect much in the way of parkinsonism and reassured the patient and her daughter in that regard. Today I had a long discussion about tremors and symptomatic treatment. Unfortunately there is not a whole lot of medication that is working well for essential tremor even though this condition is quite common and can be quite debilitating. If she would like to consider DBS surgery for this she is encouraged to make another appointment with Dr. Lorin Picket at [facility] but given her advanced age and her overall medical history in particular recurrent  PE on Coumadin therapy at this time I would shy away from recommending DBS for her. She is on a beta blocker and Mysoline at this time and I would not certainly increase the dose for fear of decreasing her heart rate further and causing lightheadedness and other side effects and sedation and balance problems in particular with the primidone. Even though she does not feel much in the way of her sponsor these medications I advised her that if she wanted to come off of these medications she would have to taper off slowly. In addition, she may find that she would have a flareup in her tremors after coming off of these medications. Sometimes medications do not seem to help subjectively but it is not until the patient comes off of medications that they realized that there tremor is actually much worse without medication. I would be reluctant to suggest any changes in her medication regimen. In addition, gabapentin has been helpful as an adjunct in her case. At this juncture, I suggested that she continue with the medications were tremors. She has been receiving refills from her with a primary care physician. For anxiety she is under medical management as well. I did not think she was completely stable walking without any assistive device. She is therefore encouraged to use a cane for gait safety.  she  has mildly impaired balance which is probably a combination of advancing age, possible medication side effect and abnormal posture. I suggested an as needed follow-up. I answered all their questions and the patient and her daughter were in agreement.   Most of my 45 minute visit today was spent in counseling and coordination of care, reviewing test results and reviewing medications and discussion of her diagnosis of essential tremor its prognosis and treatment options.  Thank you very much for allowing me to participate in the care of this nice patient. If I can be of any further assistance to you please do not hesitate to call me at [phone].  Sincerely,   Kaitlyn FoleySaima Nivedita Mirabella, MD, PhD
